# Patient Record
Sex: Female | Born: 1950 | Race: White | Hispanic: No | Smoking: Never smoker
Health system: Southern US, Community
[De-identification: ages and names within clinical notes are randomized; demographics above are authoritative.]

## PROBLEM LIST (undated history)

## (undated) DIAGNOSIS — F419 Anxiety disorder, unspecified: Secondary | ICD-10-CM

## (undated) DIAGNOSIS — F329 Major depressive disorder, single episode, unspecified: Secondary | ICD-10-CM

## (undated) DIAGNOSIS — Z973 Presence of spectacles and contact lenses: Secondary | ICD-10-CM

## (undated) DIAGNOSIS — E785 Hyperlipidemia, unspecified: Secondary | ICD-10-CM

## (undated) DIAGNOSIS — K219 Gastro-esophageal reflux disease without esophagitis: Secondary | ICD-10-CM

## (undated) DIAGNOSIS — F32A Depression, unspecified: Secondary | ICD-10-CM

## (undated) HISTORY — PX: COLONOSCOPY: SHX174

## (undated) HISTORY — PX: BREAST EXCISIONAL BIOPSY: SUR124

## (undated) HISTORY — PX: TONSILLECTOMY: SUR1361

---

## 1993-04-05 HISTORY — PX: ABDOMINAL HYSTERECTOMY: SHX81

## 1993-04-05 HISTORY — PX: APPENDECTOMY: SHX54

## 2004-07-21 ENCOUNTER — Ambulatory Visit: Payer: Self-pay

## 2004-07-27 ENCOUNTER — Ambulatory Visit: Payer: Self-pay

## 2006-11-29 ENCOUNTER — Ambulatory Visit: Payer: Self-pay

## 2007-12-07 ENCOUNTER — Ambulatory Visit: Payer: Self-pay

## 2009-12-31 ENCOUNTER — Ambulatory Visit: Payer: Self-pay

## 2011-03-22 ENCOUNTER — Other Ambulatory Visit: Payer: Self-pay

## 2011-03-22 DIAGNOSIS — Z1231 Encounter for screening mammogram for malignant neoplasm of breast: Secondary | ICD-10-CM

## 2011-04-09 ENCOUNTER — Ambulatory Visit
Admission: RE | Admit: 2011-04-09 | Discharge: 2011-04-09 | Disposition: A | Discharge status: Home / Self Care | Payer: BC Managed Care – PPO | Source: Ambulatory Visit

## 2011-04-09 DIAGNOSIS — Z1231 Encounter for screening mammogram for malignant neoplasm of breast: Secondary | ICD-10-CM

## 2012-04-27 ENCOUNTER — Other Ambulatory Visit: Payer: Self-pay

## 2012-04-27 DIAGNOSIS — Z1231 Encounter for screening mammogram for malignant neoplasm of breast: Secondary | ICD-10-CM

## 2012-05-17 ENCOUNTER — Ambulatory Visit: Payer: BC Managed Care – PPO

## 2012-05-31 ENCOUNTER — Ambulatory Visit
Admission: RE | Admit: 2012-05-31 | Discharge: 2012-05-31 | Disposition: A | Discharge status: Home / Self Care | Payer: BC Managed Care – PPO | Source: Ambulatory Visit

## 2012-05-31 DIAGNOSIS — Z1231 Encounter for screening mammogram for malignant neoplasm of breast: Secondary | ICD-10-CM

## 2013-04-05 HISTORY — PX: EYE SURGERY: SHX253

## 2013-05-02 ENCOUNTER — Other Ambulatory Visit: Payer: Self-pay

## 2013-05-02 DIAGNOSIS — Z1231 Encounter for screening mammogram for malignant neoplasm of breast: Secondary | ICD-10-CM

## 2013-06-06 ENCOUNTER — Ambulatory Visit
Admission: RE | Admit: 2013-06-06 | Discharge: 2013-06-06 | Disposition: A | Discharge status: Home / Self Care | Payer: BC Managed Care – PPO | Source: Ambulatory Visit

## 2013-06-06 DIAGNOSIS — Z1231 Encounter for screening mammogram for malignant neoplasm of breast: Secondary | ICD-10-CM

## 2013-06-07 ENCOUNTER — Other Ambulatory Visit: Payer: Self-pay

## 2013-06-07 DIAGNOSIS — R928 Other abnormal and inconclusive findings on diagnostic imaging of breast: Secondary | ICD-10-CM

## 2013-06-20 ENCOUNTER — Other Ambulatory Visit: Payer: Self-pay

## 2013-06-20 ENCOUNTER — Ambulatory Visit
Admission: RE | Admit: 2013-06-20 | Discharge: 2013-06-20 | Disposition: A | Discharge status: Home / Self Care | Payer: BC Managed Care – PPO | Source: Ambulatory Visit

## 2013-06-20 DIAGNOSIS — R928 Other abnormal and inconclusive findings on diagnostic imaging of breast: Secondary | ICD-10-CM

## 2013-06-20 DIAGNOSIS — N6310 Unspecified lump in the right breast, unspecified quadrant: Secondary | ICD-10-CM

## 2013-07-04 ENCOUNTER — Ambulatory Visit
Admission: RE | Admit: 2013-07-04 | Discharge: 2013-07-04 | Disposition: A | Discharge status: Home / Self Care | Payer: BC Managed Care – PPO | Source: Ambulatory Visit

## 2013-07-04 ENCOUNTER — Other Ambulatory Visit: Payer: Self-pay

## 2013-07-04 DIAGNOSIS — N6310 Unspecified lump in the right breast, unspecified quadrant: Secondary | ICD-10-CM

## 2013-07-08 LAB — CULTURE, ROUTINE-ABSCESS: CULTURE: NO GROWTH

## 2013-11-12 ENCOUNTER — Other Ambulatory Visit: Payer: Self-pay

## 2013-11-12 DIAGNOSIS — N6310 Unspecified lump in the right breast, unspecified quadrant: Secondary | ICD-10-CM

## 2013-11-23 ENCOUNTER — Other Ambulatory Visit: Payer: Self-pay

## 2013-11-23 DIAGNOSIS — N6310 Unspecified lump in the right breast, unspecified quadrant: Secondary | ICD-10-CM

## 2013-11-27 ENCOUNTER — Ambulatory Visit
Admission: RE | Admit: 2013-11-27 | Discharge: 2013-11-27 | Disposition: A | Discharge status: Home / Self Care | Payer: BC Managed Care – PPO | Source: Ambulatory Visit

## 2013-11-27 DIAGNOSIS — N6310 Unspecified lump in the right breast, unspecified quadrant: Secondary | ICD-10-CM

## 2013-11-28 ENCOUNTER — Telehealth (INDEPENDENT_AMBULATORY_CARE_PROVIDER_SITE_OTHER): Payer: Self-pay

## 2013-11-28 NOTE — Telephone Encounter (Signed)
Spoke to [employer] at [employer] . She states she had the scheduled for 12/06/13

## 2013-12-06 ENCOUNTER — Other Ambulatory Visit (INDEPENDENT_AMBULATORY_CARE_PROVIDER_SITE_OTHER): Payer: Self-pay | Admitting: General Surgery

## 2014-01-23 ENCOUNTER — Encounter (HOSPITAL_BASED_OUTPATIENT_CLINIC_OR_DEPARTMENT_OTHER): Payer: Self-pay | Admitting: *Deleted

## 2014-01-23 NOTE — Progress Notes (Signed)
To come in for [facility] labs /ua

## 2014-01-25 ENCOUNTER — Encounter (HOSPITAL_BASED_OUTPATIENT_CLINIC_OR_DEPARTMENT_OTHER)
Admission: RE | Admit: 2014-01-25 | Discharge: 2014-01-25 | Disposition: A | Discharge status: Home / Self Care | Payer: BC Managed Care – PPO | Source: Ambulatory Visit | Attending: General Surgery | Admitting: General Surgery

## 2014-01-25 ENCOUNTER — Encounter (INDEPENDENT_AMBULATORY_CARE_PROVIDER_SITE_OTHER): Payer: BC Managed Care – PPO | Admitting: Ophthalmology

## 2014-01-25 DIAGNOSIS — H4422 Degenerative myopia, left eye: Secondary | ICD-10-CM

## 2014-01-25 DIAGNOSIS — H35341 Macular cyst, hole, or pseudohole, right eye: Secondary | ICD-10-CM

## 2014-01-25 DIAGNOSIS — H35371 Puckering of macula, right eye: Secondary | ICD-10-CM

## 2014-01-25 DIAGNOSIS — Z01812 Encounter for preprocedural laboratory examination: Secondary | ICD-10-CM | POA: Insufficient documentation

## 2014-01-25 DIAGNOSIS — H43313 Vitreous membranes and strands, bilateral: Secondary | ICD-10-CM

## 2014-01-25 LAB — CBC WITH DIFFERENTIAL/PLATELET
BASOS PCT: 0 % (ref 0–1)
Basophils Absolute: 0 10*3/uL (ref 0.0–0.1)
Eosinophils Absolute: 0.1 10*3/uL (ref 0.0–0.7)
Eosinophils Relative: 2 % (ref 0–5)
HCT: 38.9 % (ref 36.0–46.0)
Hemoglobin: 12.6 g/dL (ref 12.0–15.0)
Lymphocytes Relative: 25 % (ref 12–46)
Lymphs Abs: 1.8 10*3/uL (ref 0.7–4.0)
MCH: 28.6 pg (ref 26.0–34.0)
MCHC: 32.4 g/dL (ref 30.0–36.0)
MCV: 88.2 fL (ref 78.0–100.0)
Monocytes Absolute: 0.9 10*3/uL (ref 0.1–1.0)
Monocytes Relative: 12 % (ref 3–12)
NEUTROS PCT: 61 % (ref 43–77)
Neutro Abs: 4.5 10*3/uL (ref 1.7–7.7)
Platelets: 186 10*3/uL (ref 150–400)
RBC: 4.41 MIL/uL (ref 3.87–5.11)
RDW: 13.5 % (ref 11.5–15.5)
WBC: 7.4 10*3/uL (ref 4.0–10.5)

## 2014-01-25 LAB — URINALYSIS, ROUTINE W REFLEX MICROSCOPIC
Bilirubin Urine: NEGATIVE
GLUCOSE, UA: NEGATIVE mg/dL
KETONES UR: NEGATIVE mg/dL
LEUKOCYTES UA: NEGATIVE
Nitrite: NEGATIVE
Protein, ur: NEGATIVE mg/dL
Specific Gravity, Urine: 1.019 (ref 1.005–1.030)
Urobilinogen, UA: 0.2 mg/dL (ref 0.0–1.0)
pH: 5.5 (ref 5.0–8.0)

## 2014-01-25 LAB — COMPREHENSIVE METABOLIC PANEL
ALK PHOS: 97 U/L (ref 39–117)
ALT: 12 U/L (ref 0–35)
AST: 21 U/L (ref 0–37)
Albumin: 3.6 g/dL (ref 3.5–5.2)
Anion gap: 13 (ref 5–15)
BILIRUBIN TOTAL: 1 mg/dL (ref 0.3–1.2)
BUN: 21 mg/dL (ref 6–23)
CO2: 26 mEq/L (ref 19–32)
Calcium: 8.9 mg/dL (ref 8.4–10.5)
Chloride: 100 mEq/L (ref 96–112)
Creatinine, Ser: 0.9 mg/dL (ref 0.50–1.10)
GFR calc Af Amer: 78 mL/min — ABNORMAL LOW (ref >90)
GFR calc non Af Amer: 67 mL/min — ABNORMAL LOW (ref >90)
Glucose, Bld: 99 mg/dL (ref 70–99)
Potassium: 4.1 mEq/L (ref 3.7–5.3)
Sodium: 139 mEq/L (ref 137–147)
Total Protein: 7 g/dL (ref 6.0–8.3)

## 2014-01-25 LAB — URINE MICROSCOPIC-ADD ON
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
## 2014-01-29 ENCOUNTER — Ambulatory Visit (HOSPITAL_BASED_OUTPATIENT_CLINIC_OR_DEPARTMENT_OTHER): Payer: BC Managed Care – PPO | Admitting: Anesthesiology

## 2014-01-29 ENCOUNTER — Encounter (HOSPITAL_BASED_OUTPATIENT_CLINIC_OR_DEPARTMENT_OTHER)
Admission: RE | Disposition: A | Discharge status: Home / Self Care | Payer: Self-pay | Source: Ambulatory Visit | Attending: General Surgery

## 2014-01-29 ENCOUNTER — Ambulatory Visit (HOSPITAL_BASED_OUTPATIENT_CLINIC_OR_DEPARTMENT_OTHER)
Admission: RE | Admit: 2014-01-29 | Discharge: 2014-01-29 | Disposition: A | Discharge status: Home / Self Care | Payer: BC Managed Care – PPO | Source: Ambulatory Visit | Attending: General Surgery | Admitting: General Surgery

## 2014-01-29 ENCOUNTER — Encounter (HOSPITAL_BASED_OUTPATIENT_CLINIC_OR_DEPARTMENT_OTHER): Payer: BC Managed Care – PPO | Admitting: Anesthesiology

## 2014-01-29 ENCOUNTER — Encounter (HOSPITAL_BASED_OUTPATIENT_CLINIC_OR_DEPARTMENT_OTHER): Payer: Self-pay | Admitting: *Deleted

## 2014-01-29 DIAGNOSIS — Z803 Family history of malignant neoplasm of breast: Secondary | ICD-10-CM | POA: Insufficient documentation

## 2014-01-29 DIAGNOSIS — N61 Inflammatory disorders of breast: Secondary | ICD-10-CM | POA: Insufficient documentation

## 2014-01-29 DIAGNOSIS — K219 Gastro-esophageal reflux disease without esophagitis: Secondary | ICD-10-CM | POA: Insufficient documentation

## 2014-01-29 DIAGNOSIS — N63 Unspecified lump in breast: Secondary | ICD-10-CM | POA: Diagnosis present

## 2014-01-29 DIAGNOSIS — N6310 Unspecified lump in the right breast, unspecified quadrant: Secondary | ICD-10-CM

## 2014-01-29 DIAGNOSIS — E780 Pure hypercholesterolemia: Secondary | ICD-10-CM | POA: Insufficient documentation

## 2014-01-29 HISTORY — DX: Hyperlipidemia, unspecified: E78.5

## 2014-01-29 HISTORY — DX: Gastro-esophageal reflux disease without esophagitis: K21.9

## 2014-01-29 HISTORY — PX: MASS EXCISION: SHX2000

## 2014-01-29 HISTORY — DX: Presence of spectacles and contact lenses: Z97.3

## 2014-01-29 HISTORY — DX: Depression, unspecified: F32.A

## 2014-01-29 HISTORY — DX: Anxiety disorder, unspecified: F41.9

## 2014-01-29 HISTORY — DX: Major depressive disorder, single episode, unspecified: F32.9

## 2014-01-29 LAB — POCT HEMOGLOBIN-HEMACUE: Hemoglobin: 12.8 g/dL (ref 12.0–15.0)

## 2014-01-29 SURGERY — EXCISION MASS
Anesthesia: General | Site: Breast | Laterality: Right

## 2014-01-29 MED ORDER — DEXAMETHASONE SODIUM PHOSPHATE 4 MG/ML IJ SOLN
INTRAMUSCULAR | Status: DC | PRN
  Administered 2014-01-29: 10 mg via INTRAVENOUS

## 2014-01-29 MED ORDER — OXYCODONE HCL 5 MG PO TABS: 5.0000 mg | ORAL_TABLET | Freq: Once | ORAL | Status: DC | PRN

## 2014-01-29 MED ORDER — ALBUTEROL SULFATE (2.5 MG/3ML) 0.083% IN NEBU
INHALATION_SOLUTION | RESPIRATORY_TRACT | Status: AC
Start: 2014-01-29 — End: 2014-01-29
  Filled 2014-01-29: qty 3

## 2014-01-29 MED ORDER — EPHEDRINE SULFATE 50 MG/ML IJ SOLN
INTRAMUSCULAR | Status: DC | PRN
  Administered 2014-01-29: 15 mg via INTRAVENOUS

## 2014-01-29 MED ORDER — MIDAZOLAM HCL 5 MG/5ML IJ SOLN
INTRAMUSCULAR | Status: DC | PRN
  Administered 2014-01-29: 2 mg via INTRAVENOUS

## 2014-01-29 MED ORDER — CEFAZOLIN SODIUM-DEXTROSE 2-3 GM-% IV SOLR
2.0000 g | INTRAVENOUS | Status: AC
Start: 2014-01-29 — End: 2014-01-29
  Administered 2014-01-29: 2 g via INTRAVENOUS

## 2014-01-29 MED ORDER — MIDAZOLAM HCL 2 MG/2ML IJ SOLN: 1.0000 mg | INTRAMUSCULAR | Status: DC | PRN

## 2014-01-29 MED ORDER — PHENYLEPHRINE HCL 10 MG/ML IJ SOLN
INTRAMUSCULAR | Status: DC | PRN
  Administered 2014-01-29: 40 ug via INTRAVENOUS

## 2014-01-29 MED ORDER — LIDOCAINE HCL (CARDIAC) 20 MG/ML IV SOLN
INTRAVENOUS | Status: DC | PRN
  Administered 2014-01-29: 75 mg via INTRAVENOUS

## 2014-01-29 MED ORDER — ACETAMINOPHEN 325 MG PO TABS: 650.0000 mg | ORAL_TABLET | ORAL | Status: DC | PRN

## 2014-01-29 MED ORDER — MIDAZOLAM HCL 2 MG/2ML IJ SOLN
INTRAMUSCULAR | Status: AC
  Filled 2014-01-29: qty 2

## 2014-01-29 MED ORDER — FENTANYL CITRATE 0.05 MG/ML IJ SOLN
INTRAMUSCULAR | Status: DC | PRN
  Administered 2014-01-29 (×2): 50 ug via INTRAVENOUS

## 2014-01-29 MED ORDER — HYDROMORPHONE HCL 1 MG/ML IJ SOLN: 0.2500 mg | INTRAMUSCULAR | Status: DC | PRN

## 2014-01-29 MED ORDER — CHLORHEXIDINE GLUCONATE 4 % EX LIQD: 1.0000 "application " | Freq: Once | CUTANEOUS | Status: DC

## 2014-01-29 MED ORDER — LACTATED RINGERS IV SOLN
INTRAVENOUS | Status: DC
Start: 2014-01-29 — End: 2014-01-29
  Administered 2014-01-29 (×3): via INTRAVENOUS

## 2014-01-29 MED ORDER — ACETAMINOPHEN 650 MG RE SUPP: 650.0000 mg | RECTAL | Status: DC | PRN

## 2014-01-29 MED ORDER — FENTANYL CITRATE 0.05 MG/ML IJ SOLN
INTRAMUSCULAR | Status: AC
  Filled 2014-01-29: qty 2

## 2014-01-29 MED ORDER — SODIUM CHLORIDE 0.9 % IJ SOLN: 3.0000 mL | INTRAMUSCULAR | Status: DC | PRN

## 2014-01-29 MED ORDER — FENTANYL CITRATE 0.05 MG/ML IJ SOLN: 50.0000 ug | INTRAMUSCULAR | Status: DC | PRN

## 2014-01-29 MED ORDER — FENTANYL CITRATE 0.05 MG/ML IJ SOLN
25.0000 ug | INTRAMUSCULAR | Status: DC | PRN
  Administered 2014-01-29 (×3): 25 ug via INTRAVENOUS

## 2014-01-29 MED ORDER — AMOXICILLIN-POT CLAVULANATE 875-125 MG PO TABS: 1.0000 | ORAL_TABLET | Freq: Two times a day (BID) | ORAL | Status: AC

## 2014-01-29 MED ORDER — HYDROCODONE-ACETAMINOPHEN 5-325 MG PO TABS: 1.0000 | ORAL_TABLET | Freq: Four times a day (QID) | ORAL | Status: AC | PRN

## 2014-01-29 MED ORDER — ONDANSETRON HCL 4 MG/2ML IJ SOLN: 4.0000 mg | Freq: Once | INTRAMUSCULAR | Status: DC | PRN

## 2014-01-29 MED ORDER — PROPOFOL 10 MG/ML IV BOLUS
INTRAVENOUS | Status: DC | PRN
  Administered 2014-01-29: 200 mg via INTRAVENOUS

## 2014-01-29 MED ORDER — FENTANYL CITRATE 0.05 MG/ML IJ SOLN
INTRAMUSCULAR | Status: AC
  Filled 2014-01-29: qty 6

## 2014-01-29 MED ORDER — ALBUTEROL SULFATE (2.5 MG/3ML) 0.083% IN NEBU
2.5000 mg | INHALATION_SOLUTION | Freq: Four times a day (QID) | RESPIRATORY_TRACT | Status: DC | PRN
  Administered 2014-01-29: 2.5 mg via RESPIRATORY_TRACT

## 2014-01-29 MED ORDER — CEFAZOLIN SODIUM-DEXTROSE 2-3 GM-% IV SOLR
INTRAVENOUS | Status: AC
  Filled 2014-01-29: qty 50

## 2014-01-29 MED ORDER — FENTANYL CITRATE 0.05 MG/ML IJ SOLN: 25.0000 ug | INTRAMUSCULAR | Status: DC | PRN

## 2014-01-29 MED ORDER — ONDANSETRON HCL 4 MG/2ML IJ SOLN
INTRAMUSCULAR | Status: DC | PRN
  Administered 2014-01-29: 4 mg via INTRAVENOUS

## 2014-01-29 MED ORDER — SODIUM CHLORIDE 0.9 % IV SOLN: INTRAVENOUS | Status: DC

## 2014-01-29 MED ORDER — SODIUM CHLORIDE 0.9 % IJ SOLN: 3.0000 mL | Freq: Two times a day (BID) | INTRAMUSCULAR | Status: DC

## 2014-01-29 MED ORDER — BUPIVACAINE-EPINEPHRINE 0.5% -1:200000 IJ SOLN
INTRAMUSCULAR | Status: DC | PRN
  Administered 2014-01-29: 10 mL

## 2014-01-29 MED ORDER — SODIUM CHLORIDE 0.9 % IV SOLN: 250.0000 mL | INTRAVENOUS | Status: DC | PRN

## 2014-01-29 MED ORDER — OXYCODONE HCL 5 MG/5ML PO SOLN: 5.0000 mg | Freq: Once | ORAL | Status: DC | PRN

## 2014-01-29 MED ORDER — OXYCODONE HCL 5 MG PO TABS: 5.0000 mg | ORAL_TABLET | ORAL | Status: DC | PRN

## 2014-01-29 SURGICAL SUPPLY — 67 items
APPLIER CLIP 9.375 MED OPEN (MISCELLANEOUS)
BENZOIN TINCTURE PRP APPL 2/3 (GAUZE/BANDAGES/DRESSINGS) IMPLANT
BINDER BREAST LRG (GAUZE/BANDAGES/DRESSINGS) ×3 IMPLANT
BINDER BREAST MEDIUM (GAUZE/BANDAGES/DRESSINGS) IMPLANT
BINDER BREAST XLRG (GAUZE/BANDAGES/DRESSINGS) IMPLANT
BINDER BREAST XXLRG (GAUZE/BANDAGES/DRESSINGS) IMPLANT
BIOPATCH RED 1 DISK 7.0 (GAUZE/BANDAGES/DRESSINGS) ×2 IMPLANT
BIOPATCH RED 1IN DISK 7.0MM (GAUZE/BANDAGES/DRESSINGS) ×1
BLADE HEX COATED 2.75 (ELECTRODE) ×3 IMPLANT
BLADE SURG 15 STRL LF DISP TIS (BLADE) ×1 IMPLANT
BLADE SURG 15 STRL SS (BLADE) ×2
CANISTER SUCT 1200ML W/VALVE (MISCELLANEOUS) ×3 IMPLANT
CHLORAPREP W/TINT 26ML (MISCELLANEOUS) ×3 IMPLANT
CLIP APPLIE 9.375 MED OPEN (MISCELLANEOUS) IMPLANT
CLOSURE WOUND 1/2 X4 (GAUZE/BANDAGES/DRESSINGS)
COVER BACK TABLE 60X90IN (DRAPES) ×3 IMPLANT
COVER MAYO STAND STRL (DRAPES) ×3 IMPLANT
DECANTER SPIKE VIAL GLASS SM (MISCELLANEOUS) IMPLANT
DEVICE DUBIN W/COMP PLATE 8390 (MISCELLANEOUS) IMPLANT
DRAPE LAPAROSCOPIC ABDOMINAL (DRAPES) IMPLANT
DRAPE LAPAROTOMY TRNSV 102X78 (DRAPE) IMPLANT
DRAPE PED LAPAROTOMY (DRAPES) ×3 IMPLANT
DRAPE UTILITY XL STRL (DRAPES) ×3 IMPLANT
DRSG PAD ABDOMINAL 8X10 ST (GAUZE/BANDAGES/DRESSINGS) ×3 IMPLANT
ELECT REM PT RETURN 9FT ADLT (ELECTROSURGICAL) ×3
ELECTRODE REM PT RTRN 9FT ADLT (ELECTROSURGICAL) ×1 IMPLANT
GAUZE SPONGE 4X4 12PLY STRL (GAUZE/BANDAGES/DRESSINGS) ×3 IMPLANT
GAUZE SPONGE 4X4 16PLY XRAY LF (GAUZE/BANDAGES/DRESSINGS) ×6 IMPLANT
GLOVE BIO SURGEON STRL SZ 6.5 (GLOVE) ×2 IMPLANT
GLOVE BIO SURGEONS STRL SZ 6.5 (GLOVE) ×1
GLOVE BIOGEL PI IND STRL 7.0 (GLOVE) ×1 IMPLANT
GLOVE BIOGEL PI INDICATOR 7.0 (GLOVE) ×2
GLOVE EUDERMIC 7 POWDERFREE (GLOVE) ×3 IMPLANT
GOWN STRL REUS W/ TWL LRG LVL3 (GOWN DISPOSABLE) ×1 IMPLANT
GOWN STRL REUS W/ TWL XL LVL3 (GOWN DISPOSABLE) ×1 IMPLANT
GOWN STRL REUS W/TWL LRG LVL3 (GOWN DISPOSABLE) ×2
GOWN STRL REUS W/TWL XL LVL3 (GOWN DISPOSABLE) ×2
KIT MARKER MARGIN INK (KITS) IMPLANT
LIQUID BAND (GAUZE/BANDAGES/DRESSINGS) IMPLANT
NEEDLE HYPO 22GX1.5 SAFETY (NEEDLE) IMPLANT
NEEDLE HYPO 25X1 1.5 SAFETY (NEEDLE) ×3 IMPLANT
NS IRRIG 1000ML POUR BTL (IV SOLUTION) ×3 IMPLANT
PACK BASIN DAY SURGERY FS (CUSTOM PROCEDURE TRAY) ×3 IMPLANT
PENCIL BUTTON HOLSTER BLD 10FT (ELECTRODE) ×3 IMPLANT
SLEEVE SCD COMPRESS KNEE MED (MISCELLANEOUS) ×3 IMPLANT
SPONGE GAUZE 4X4 12PLY STER LF (GAUZE/BANDAGES/DRESSINGS) IMPLANT
SPONGE LAP 18X18 X RAY DECT (DISPOSABLE) ×3 IMPLANT
SPONGE LAP 4X18 X RAY DECT (DISPOSABLE) ×3 IMPLANT
STAPLER VISISTAT 35W (STAPLE) IMPLANT
STRIP CLOSURE SKIN 1/2X4 (GAUZE/BANDAGES/DRESSINGS) IMPLANT
SUT ETHILON 4 0 PS 2 18 (SUTURE) IMPLANT
SUT MNCRL AB 4-0 PS2 18 (SUTURE) ×3 IMPLANT
SUT SILK 2 0 SH (SUTURE) ×3 IMPLANT
SUT VIC AB 2-0 SH 27 (SUTURE)
SUT VIC AB 2-0 SH 27XBRD (SUTURE) IMPLANT
SUT VIC AB 3-0 FS2 27 (SUTURE) IMPLANT
SUT VIC AB 4-0 P-3 18XBRD (SUTURE) IMPLANT
SUT VIC AB 4-0 P3 18 (SUTURE)
SUT VICRYL 3-0 CR8 SH (SUTURE) ×3 IMPLANT
SUT VICRYL 4-0 PS2 18IN ABS (SUTURE) IMPLANT
SYR BULB 3OZ (MISCELLANEOUS) IMPLANT
SYRINGE 10CC LL (SYRINGE) ×3 IMPLANT
TAPE HYPAFIX 4 X10 (GAUZE/BANDAGES/DRESSINGS) IMPLANT
TOWEL OR NON WOVEN STRL DISP B (DISPOSABLE) ×3 IMPLANT
TUBE CONNECTING 20'X1/4 (TUBING) ×1
TUBE CONNECTING 20X1/4 (TUBING) ×2 IMPLANT
YANKAUER SUCT BULB TIP NO VENT (SUCTIONS) ×3 IMPLANT

## 2014-01-29 NOTE — Anesthesia Preprocedure Evaluation (Signed)
Anesthesia Evaluation  Patient identified by MRN, date of birth, ID band Patient awake    Reviewed: Allergy & Precautions, H&P , NPO status , Patient's Chart, lab work & pertinent test results  Airway Mallampati: I  TM Distance: >3 FB Neck ROM: Full    Dental  (+) Teeth Intact, Dental Advisory Given   Pulmonary  breath sounds clear to auscultation        Cardiovascular Rhythm:Regular Rate:Normal     Neuro/Psych    GI/Hepatic GERD-  Medicated and Controlled,  Endo/Other    Renal/GU      Musculoskeletal   Abdominal   Peds  Hematology   Anesthesia Other Findings   Reproductive/Obstetrics                             Anesthesia Physical Anesthesia Plan  ASA: II  Anesthesia Plan: General   Post-op Pain Management:    Induction: Intravenous  Airway Management Planned: LMA  Additional Equipment:   Intra-op Plan:   Post-operative Plan: Extubation in OR  Informed Consent: I have reviewed the patients History and Physical, chart, labs and discussed the procedure including the risks, benefits and alternatives for the proposed anesthesia with the patient or authorized representative who has indicated his/her understanding and acceptance.   Dental advisory given  Plan Discussed with: CRNA, Anesthesiologist and Surgeon  Anesthesia Plan Comments:         Anesthesia Quick Evaluation

## 2014-01-29 NOTE — Anesthesia Postprocedure Evaluation (Signed)
  Anesthesia Post-op Note  Patient: Melinda Sullivan  Procedure(s) Performed: Procedure(s): EXCISE RIGHT BREAST MASS (Right)  Patient Location: PACU  Anesthesia Type: General   Level of Consciousness: awake, alert  and oriented  Airway and Oxygen Therapy: Patient Spontanous Breathing  Post-op Pain: mild  Post-op Assessment: Post-op Vital signs reviewed  Post-op Vital Signs: Reviewed  Last Vitals:  Filed Vitals:   01/29/14 1200  BP: 181/77  Pulse: 76  Temp:   Resp: 9    Complications: No apparent anesthesia complications

## 2014-01-29 NOTE — Transfer of Care (Signed)
Immediate Anesthesia Transfer of Care Note  Patient: Melinda Sullivan  Procedure(s) Performed: Procedure(s): EXCISE RIGHT BREAST MASS (Right)  Patient Location: PACU  Anesthesia Type:General  Level of Consciousness: awake and patient cooperative  Airway & Oxygen Therapy: Patient Spontanous Breathing and Patient connected to face mask oxygen  Post-op Assessment: Report given to PACU RN and Post -op Vital signs reviewed and stable  Post vital signs: Reviewed and stable  Complications: No apparent anesthesia complications

## 2014-01-29 NOTE — Discharge Instructions (Signed)
Keep a written record of the drainage and bring that to the office with you next week.  You will need to sponge bathe. No showers or tub baths until we remove the drain.  You may drive a car when comfortable, but not until tomorrow.       [place] Office Phone Number [phone]  BREAST BIOPSY/ PARTIAL MASTECTOMY: POST OP INSTRUCTIONS  Always review your discharge instruction sheet given to you by the facility where your surgery was performed.  IF YOU HAVE DISABILITY OR FAMILY LEAVE FORMS, YOU MUST BRING THEM TO THE OFFICE FOR PROCESSING.  DO NOT GIVE THEM TO YOUR DOCTOR.  1. A prescription for pain medication may be given to you upon discharge.  Take your pain medication as prescribed, if needed.  If narcotic pain medicine is not needed, then you may take acetaminophen (Tylenol) or ibuprofen (Advil) as needed. 2. Take your usually prescribed medications unless otherwise directed 3. If you need a refill on your pain medication, please contact your pharmacy.  They will contact our office to request authorization.  Prescriptions will not be filled after 5pm or on week-ends. 4. You should eat very light the first 24 hours after surgery, such as soup, crackers, pudding, etc.  Resume your normal diet the day after surgery. 5. Most patients will experience some swelling and bruising in the breast.  Ice packs and a good support bra will help.  Swelling and bruising can take several days to resolve.  6. It is common to experience some constipation if taking pain medication after surgery.  Increasing fluid intake and taking a stool softener will usually help or prevent this problem from occurring.  A mild laxative (Milk of Magnesia or Miralax) should be taken according to package directions if there are no bowel movements after 48 hours. 7. Unless discharge instructions indicate otherwise, you may remove your bandages 24-48 hours after surgery, and you may shower at that time.   You may have steri-strips (small skin tapes) in place directly over the incision.  These strips should be left on the skin for 7-10 days.  If your surgeon used skin glue on the incision, you may shower in 24 hours.  The glue will flake off over the next 2-3 weeks.  Any sutures or staples will be removed at the office during your follow-up visit. 8. ACTIVITIES:  You may resume regular daily activities (gradually increasing) beginning the next day.  Wearing a good support bra or sports bra minimizes pain and swelling.  You may have sexual intercourse when it is comfortable. a. You may drive when you no longer are taking prescription pain medication, you can comfortably wear a seatbelt, and you can safely maneuver your car and apply brakes. b. RETURN TO WORK:  ______________________________________________________________________________________ 9. You should see your doctor in the office for a follow-up appointment approximately two weeks after your surgery.  Your doctors nurse will typically make your follow-up appointment when she calls you with your pathology report.  Expect your pathology report 2-3 business days after your surgery.  You may call to check if you do not hear from [place] after three days. 10. OTHER INSTRUCTIONS: _______________________________________________________________________________________________ _____________________________________________________________________________________________________________________________________ _____________________________________________________________________________________________________________________________________ _____________________________________________________________________________________________________________________________________  WHEN TO CALL YOUR DOCTOR: 1. Fever over 101.0 2. Nausea and/or vomiting. 3. Extreme swelling or bruising. 4. Continued bleeding from incision. 5. Increased pain, redness, or drainage from  the incision.  The clinic staff is available to answer your questions during regular business hours.  Please dont hesitate  to call and ask to speak to one of the nurses for clinical concerns.  If you have a medical emergency, go to the nearest emergency room or call 911.  A surgeon from [facility] is always on call at the hospital.  For further questions, please visit [email or web address]   Post Anesthesia Home Care Instructions  Activity: Get plenty of rest for the remainder of the day. A responsible adult should stay with you for 24 hours following the procedure.  For the next 24 hours, DO NOT: -Drive a car -Paediatric nurse -Drink alcoholic beverages -Take any medication unless instructed by your physician -Make any legal decisions or sign important papers.  Meals: Start with liquid foods such as gelatin or soup. Progress to regular foods as tolerated. Avoid greasy, spicy, heavy foods. If nausea and/or vomiting occur, drink only clear liquids until the nausea and/or vomiting subsides. Call your physician if vomiting continues.  Special Instructions/Symptoms: Your throat may feel dry or sore from the anesthesia or the breathing tube placed in your throat during surgery. If this causes discomfort, gargle with warm salt water. The discomfort should disappear within 24 hours.  About my Jackson-Pratt Bulb Drain  What is a Jackson-Pratt bulb? A Jackson-Pratt is a soft, round device used to collect drainage. It is connected to a long, thin drainage catheter, which is held in place by one or two small stiches near your surgical incision site. When the bulb is squeezed, it forms a vacuum, forcing the drainage to empty into the bulb.   Squeezing the Jackson-Pratt Bulb- To squeeze the bulb: 1. Make sure the plug at the top of the bulb is open. 2. Squeeze the bulb tightly in your fist. You will hear air squeezing from the bulb. 3. Replace the plug while the bulb is  squeezed. 4. Use a safety pin to attach the bulb to your clothing. This will keep the catheter from     pulling at the bulb insertion site.  When to call your doctor- Call your doctor if:  Drain site becomes red, swollen or hot.  You have a fever greater than 101 degrees F.  There is oozing at the drain site.  Drain falls out (apply a guaze bandage over the drain hole and secure it with tape).  Drainage increases daily not related to activity patterns. (You will usually have more drainage when you are active than when you are resting.)  Drainage has a bad odor.    JP Drain [employer] this sheet to all of your post-operative appointments while you have your drains.  Please measure your drains by CC's or ML's.  Make sure you drain and measure your JP Drains 3 times per day.  At the end of each day, add up totals for the left side and add up totals for the right side.    ( 9 am )     ( 3 pm )        ( 9 pm )                Date L  R  L  R  L  R  Total L/R

## 2014-01-29 NOTE — Anesthesia Procedure Notes (Signed)
Procedure Name: LMA Insertion Date/Time: 01/29/2014 10:20 AM Performed by: Melynda Ripple D Pre-anesthesia Checklist: Patient identified, Emergency Drugs available, Suction available and Patient being monitored Patient Re-evaluated:Patient Re-evaluated prior to inductionOxygen Delivery Method: Circle System Utilized Preoxygenation: Pre-oxygenation with 100% oxygen Intubation Type: IV induction Ventilation: Mask ventilation without difficulty LMA: LMA inserted LMA Size: 4.0 Number of attempts: 1 Airway Equipment and Method: bite block Placement Confirmation: positive ETCO2 Tube secured with: Tape Dental Injury: Teeth and Oropharynx as per pre-operative assessment

## 2014-01-29 NOTE — Interval H&P Note (Signed)
History and Physical Interval Note:  01/29/2014 9:15 AM  Melinda Sullivan  has presented today for surgery, with the diagnosis of breast mass  The various methods of treatment have been discussed with the patient and family. After consideration of risks, benefits and other options for treatment, the patient has consented to  Procedure(s): EXCISE RIGHT BREAST MASS (Right) as a surgical intervention .  The patient's history has been reviewed, patient examined today, no change in status, stable for surgery.  I have reviewed the patient's chart and labs.  Questions were answered to the patient's satisfaction.     [name]
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
## 2014-01-30 NOTE — Progress Notes (Signed)
Quick Note:  Inform patient of Pathology report,. Benign. Good news. Will discuss in detail at next [place] ______

## 2014-01-31 ENCOUNTER — Encounter (HOSPITAL_BASED_OUTPATIENT_CLINIC_OR_DEPARTMENT_OTHER): Payer: Self-pay | Admitting: General Surgery

## 2014-01-31 ENCOUNTER — Telehealth (INDEPENDENT_AMBULATORY_CARE_PROVIDER_SITE_OTHER): Payer: Self-pay

## 2014-01-31 NOTE — Telephone Encounter (Signed)
Message copied by Dois Davenport on Thu Jan 31, 2014  9:30 AM ------      Message from: [name]      Created: Wed Jan 30, 2014 12:50 PM       Inform patient of Pathology report,. Benign.  Good news. Will discuss in detail at next OV.            hmi ------

## 2014-01-31 NOTE — Telephone Encounter (Signed)
Per Dr Darrel Hoover request pt notified of path result.

## 2014-02-01 LAB — CULTURE, ROUTINE-ABSCESS: Culture: NO GROWTH

## 2014-02-03 LAB — ANAEROBIC CULTURE

## 2014-07-31 ENCOUNTER — Ambulatory Visit (INDEPENDENT_AMBULATORY_CARE_PROVIDER_SITE_OTHER): Payer: BLUE CROSS/BLUE SHIELD | Admitting: Ophthalmology

## 2014-07-31 DIAGNOSIS — H35341 Macular cyst, hole, or pseudohole, right eye: Secondary | ICD-10-CM

## 2014-07-31 DIAGNOSIS — H43813 Vitreous degeneration, bilateral: Secondary | ICD-10-CM

## 2014-12-19 ENCOUNTER — Other Ambulatory Visit: Payer: Self-pay

## 2014-12-19 DIAGNOSIS — Z1231 Encounter for screening mammogram for malignant neoplasm of breast: Secondary | ICD-10-CM

## 2015-02-18 ENCOUNTER — Ambulatory Visit
Admission: RE | Admit: 2015-02-18 | Discharge: 2015-02-18 | Disposition: A | Discharge status: Home / Self Care | Payer: BLUE CROSS/BLUE SHIELD | Source: Ambulatory Visit

## 2015-02-18 DIAGNOSIS — Z1231 Encounter for screening mammogram for malignant neoplasm of breast: Secondary | ICD-10-CM

## 2015-08-06 ENCOUNTER — Ambulatory Visit (INDEPENDENT_AMBULATORY_CARE_PROVIDER_SITE_OTHER): Payer: BLUE CROSS/BLUE SHIELD | Admitting: Ophthalmology

## 2016-01-20 ENCOUNTER — Other Ambulatory Visit: Payer: Self-pay | Admitting: Family Medicine

## 2016-01-20 DIAGNOSIS — Z1231 Encounter for screening mammogram for malignant neoplasm of breast: Secondary | ICD-10-CM

## 2016-02-19 ENCOUNTER — Ambulatory Visit
Admission: RE | Admit: 2016-02-19 | Discharge: 2016-02-19 | Disposition: A | Discharge status: Home / Self Care | Payer: BLUE CROSS/BLUE SHIELD | Source: Ambulatory Visit | Attending: Family Medicine | Admitting: Family Medicine

## 2016-02-19 DIAGNOSIS — Z1231 Encounter for screening mammogram for malignant neoplasm of breast: Secondary | ICD-10-CM

## 2016-06-23 ENCOUNTER — Encounter (INDEPENDENT_AMBULATORY_CARE_PROVIDER_SITE_OTHER): Payer: Medicare Other | Admitting: Ophthalmology

## 2016-06-23 DIAGNOSIS — I10 Essential (primary) hypertension: Secondary | ICD-10-CM

## 2016-06-23 DIAGNOSIS — H43813 Vitreous degeneration, bilateral: Secondary | ICD-10-CM | POA: Diagnosis not present

## 2016-06-23 DIAGNOSIS — H35033 Hypertensive retinopathy, bilateral: Secondary | ICD-10-CM

## 2016-06-23 DIAGNOSIS — H35341 Macular cyst, hole, or pseudohole, right eye: Secondary | ICD-10-CM | POA: Diagnosis not present

## 2016-12-28 ENCOUNTER — Other Ambulatory Visit: Payer: Self-pay | Admitting: Family Medicine

## 2016-12-28 DIAGNOSIS — Z1231 Encounter for screening mammogram for malignant neoplasm of breast: Secondary | ICD-10-CM

## 2017-02-23 ENCOUNTER — Ambulatory Visit
Admission: RE | Admit: 2017-02-23 | Discharge: 2017-02-23 | Disposition: A | Discharge status: Home / Self Care | Payer: Medicare Other | Source: Ambulatory Visit | Attending: Family Medicine | Admitting: Family Medicine

## 2017-02-23 DIAGNOSIS — Z1231 Encounter for screening mammogram for malignant neoplasm of breast: Secondary | ICD-10-CM

## 2017-06-29 ENCOUNTER — Ambulatory Visit (INDEPENDENT_AMBULATORY_CARE_PROVIDER_SITE_OTHER): Payer: Medicare Other | Admitting: Ophthalmology

## 2017-08-12 ENCOUNTER — Encounter (INDEPENDENT_AMBULATORY_CARE_PROVIDER_SITE_OTHER): Payer: Medicare Other | Admitting: Ophthalmology

## 2017-08-12 DIAGNOSIS — H43813 Vitreous degeneration, bilateral: Secondary | ICD-10-CM

## 2017-08-12 DIAGNOSIS — H35341 Macular cyst, hole, or pseudohole, right eye: Secondary | ICD-10-CM | POA: Diagnosis not present

## 2017-08-12 DIAGNOSIS — H35033 Hypertensive retinopathy, bilateral: Secondary | ICD-10-CM

## 2017-08-12 DIAGNOSIS — I10 Essential (primary) hypertension: Secondary | ICD-10-CM

## 2018-01-13 ENCOUNTER — Other Ambulatory Visit: Payer: Self-pay | Admitting: Family Medicine

## 2018-01-13 DIAGNOSIS — Z1231 Encounter for screening mammogram for malignant neoplasm of breast: Secondary | ICD-10-CM

## 2018-02-24 ENCOUNTER — Ambulatory Visit
Admission: RE | Admit: 2018-02-24 | Discharge: 2018-02-24 | Disposition: A | Discharge status: Home / Self Care | Payer: Medicare Other | Source: Ambulatory Visit | Attending: Family Medicine | Admitting: Family Medicine

## 2018-02-24 DIAGNOSIS — Z1231 Encounter for screening mammogram for malignant neoplasm of breast: Secondary | ICD-10-CM

## 2018-08-15 ENCOUNTER — Encounter (INDEPENDENT_AMBULATORY_CARE_PROVIDER_SITE_OTHER): Payer: Medicare Other | Admitting: Ophthalmology

## 2018-08-15 ENCOUNTER — Other Ambulatory Visit: Payer: Self-pay

## 2018-08-15 DIAGNOSIS — H35341 Macular cyst, hole, or pseudohole, right eye: Secondary | ICD-10-CM | POA: Diagnosis not present

## 2018-08-15 DIAGNOSIS — H43813 Vitreous degeneration, bilateral: Secondary | ICD-10-CM

## 2018-08-15 DIAGNOSIS — H35033 Hypertensive retinopathy, bilateral: Secondary | ICD-10-CM | POA: Diagnosis not present

## 2018-08-15 DIAGNOSIS — I10 Essential (primary) hypertension: Secondary | ICD-10-CM

## 2019-01-09 ENCOUNTER — Other Ambulatory Visit: Payer: Self-pay | Admitting: Family Medicine

## 2019-01-09 DIAGNOSIS — Z1231 Encounter for screening mammogram for malignant neoplasm of breast: Secondary | ICD-10-CM

## 2019-02-27 ENCOUNTER — Ambulatory Visit
Admission: RE | Admit: 2019-02-27 | Discharge: 2019-02-27 | Disposition: A | Discharge status: Home / Self Care | Payer: Medicare Other | Source: Ambulatory Visit | Attending: Family Medicine | Admitting: Family Medicine

## 2019-02-27 ENCOUNTER — Other Ambulatory Visit: Payer: Self-pay

## 2019-02-27 DIAGNOSIS — Z1231 Encounter for screening mammogram for malignant neoplasm of breast: Secondary | ICD-10-CM

## 2019-08-16 ENCOUNTER — Other Ambulatory Visit: Payer: Self-pay

## 2019-08-16 ENCOUNTER — Encounter (INDEPENDENT_AMBULATORY_CARE_PROVIDER_SITE_OTHER): Payer: Medicare Other | Admitting: Ophthalmology

## 2019-08-16 DIAGNOSIS — H35371 Puckering of macula, right eye: Secondary | ICD-10-CM

## 2019-08-16 DIAGNOSIS — H43813 Vitreous degeneration, bilateral: Secondary | ICD-10-CM

## 2019-08-16 DIAGNOSIS — H35033 Hypertensive retinopathy, bilateral: Secondary | ICD-10-CM | POA: Diagnosis not present

## 2019-08-16 DIAGNOSIS — I10 Essential (primary) hypertension: Secondary | ICD-10-CM

## 2019-08-16 DIAGNOSIS — H35341 Macular cyst, hole, or pseudohole, right eye: Secondary | ICD-10-CM

## 2019-10-31 ENCOUNTER — Ambulatory Visit: Payer: Medicare Other | Admitting: Dermatology

## 2019-10-31 ENCOUNTER — Other Ambulatory Visit: Payer: Self-pay

## 2019-10-31 DIAGNOSIS — L719 Rosacea, unspecified: Secondary | ICD-10-CM | POA: Diagnosis not present

## 2019-10-31 DIAGNOSIS — D229 Melanocytic nevi, unspecified: Secondary | ICD-10-CM

## 2019-10-31 DIAGNOSIS — D2361 Other benign neoplasm of skin of right upper limb, including shoulder: Secondary | ICD-10-CM

## 2019-10-31 DIAGNOSIS — Z1283 Encounter for screening for malignant neoplasm of skin: Secondary | ICD-10-CM | POA: Diagnosis not present

## 2019-10-31 DIAGNOSIS — L814 Other melanin hyperpigmentation: Secondary | ICD-10-CM

## 2019-10-31 DIAGNOSIS — L820 Inflamed seborrheic keratosis: Secondary | ICD-10-CM

## 2019-10-31 DIAGNOSIS — D1800 Hemangioma unspecified site: Secondary | ICD-10-CM

## 2019-10-31 DIAGNOSIS — L821 Other seborrheic keratosis: Secondary | ICD-10-CM

## 2019-10-31 DIAGNOSIS — D239 Other benign neoplasm of skin, unspecified: Secondary | ICD-10-CM

## 2019-10-31 DIAGNOSIS — L578 Other skin changes due to chronic exposure to nonionizing radiation: Secondary | ICD-10-CM

## 2019-10-31 NOTE — Progress Notes (Signed)
   Follow-Up Visit   Subjective  Melinda Sullivan is a 69 y.o. female who presents for the following: Annual Exam. The patient presents for Total-Body Skin Exam (TBSE) for skin cancer screening and mole check.  Patient here today for TBSE. No history of skin cancer and patient not aware of anything new or changing.  She complains of spots that are irritating and itching on the trunk and neck.  The following portions of the chart were reviewed this encounter and updated as appropriate:  Allergies  Meds  Problems  Med Hx  Surg Hx  Fam Hx     Review of Systems:  No other skin or systemic complaints except as noted in HPI or Assessment and Plan.  Objective  Well appearing patient in no apparent distress; mood and affect are within normal limits.  A full examination was performed including scalp, head, eyes, ears, nose, lips, neck, chest, axillae, abdomen, back, buttocks, bilateral upper extremities, bilateral lower extremities, hands, feet, fingers, toes, fingernails, and toenails. All findings within normal limits unless otherwise noted below.  Objective  Face: Mild pinkness at cheeks  Objective  Right Upper Arm: Firm pink/brown papulenodule with dimple sign.   Objective  Neck x 15, R side x 6, L side x 5 (26): Erythematous keratotic or waxy stuck-on papule or plaque.    Assessment & Plan  Rosacea Face  Benign, observe.    Dermatofibroma Right Upper Arm Mild, discussed laser treatment with BBL which we do here at [facility]. Benign, observe.   Inflamed seborrheic keratosis (26) Neck x 15, R side x 6, L side x 5  Destruction of lesion - Neck x 15, R side x 6, L side x 5 Complexity: simple   Destruction method: cryotherapy   Informed consent: discussed and consent obtained   Timeout:  patient name, date of birth, surgical site, and procedure verified Lesion destroyed using liquid nitrogen: Yes   Region frozen until ice ball extended beyond  lesion: Yes   Outcome: patient tolerated procedure well with no complications   Post-procedure details: wound care instructions given    Skin cancer screening   Lentigines - Scattered tan macules - Discussed due to sun exposure - Benign, observe - Call for any changes  Seborrheic Keratoses - Stuck-on, waxy, tan-brown papules and plaques  - Discussed benign etiology and prognosis. - Observe - Call for any changes  Melanocytic Nevi - Tan-brown and/or pink-flesh-colored symmetric macules and papules - Benign appearing on exam today - Observation - Call clinic for new or changing moles - Recommend daily use of broad spectrum spf 30+ sunscreen to sun-exposed areas.   Hemangiomas - Red papules - Discussed benign nature - Observe - Call for any changes  Actinic Damage - diffuse scaly erythematous macules with underlying dyspigmentation - Recommend daily broad spectrum sunscreen SPF 30+ to sun-exposed areas, reapply every 2 hours as needed.  - Call for new or changing lesions.  Skin cancer screening performed today.  Return in about 1 year (around 10/30/2020) for TBSE.  Graciella Belton, RMA, am acting as scribe for Sarina Ser, MD . Documentation: I have reviewed the above documentation for accuracy and completeness, and I agree with the above.  Sarina Ser, MD

## 2019-10-31 NOTE — Patient Instructions (Addendum)

## 2019-11-04 ENCOUNTER — Encounter: Payer: Self-pay | Admitting: Dermatology

## 2020-01-09 ENCOUNTER — Other Ambulatory Visit: Payer: Self-pay | Admitting: Family Medicine

## 2020-01-09 DIAGNOSIS — Z1231 Encounter for screening mammogram for malignant neoplasm of breast: Secondary | ICD-10-CM

## 2020-03-04 ENCOUNTER — Ambulatory Visit
Admission: RE | Admit: 2020-03-04 | Discharge: 2020-03-04 | Disposition: A | Discharge status: Home / Self Care | Payer: Medicare Other | Source: Ambulatory Visit | Attending: Family Medicine | Admitting: Family Medicine

## 2020-03-04 ENCOUNTER — Other Ambulatory Visit: Payer: Self-pay

## 2020-03-04 DIAGNOSIS — Z1231 Encounter for screening mammogram for malignant neoplasm of breast: Secondary | ICD-10-CM

## 2020-03-05 ENCOUNTER — Other Ambulatory Visit: Payer: Self-pay | Admitting: Family Medicine

## 2020-03-05 DIAGNOSIS — R928 Other abnormal and inconclusive findings on diagnostic imaging of breast: Secondary | ICD-10-CM

## 2020-03-21 ENCOUNTER — Ambulatory Visit
Admission: RE | Admit: 2020-03-21 | Discharge: 2020-03-21 | Disposition: A | Discharge status: Home / Self Care | Payer: Medicare Other | Source: Ambulatory Visit | Attending: Family Medicine | Admitting: Family Medicine

## 2020-03-21 ENCOUNTER — Other Ambulatory Visit: Payer: Self-pay

## 2020-03-21 ENCOUNTER — Other Ambulatory Visit: Payer: Self-pay | Admitting: Family Medicine

## 2020-03-21 DIAGNOSIS — R928 Other abnormal and inconclusive findings on diagnostic imaging of breast: Secondary | ICD-10-CM

## 2020-08-19 ENCOUNTER — Other Ambulatory Visit: Payer: Self-pay

## 2020-08-19 ENCOUNTER — Encounter (INDEPENDENT_AMBULATORY_CARE_PROVIDER_SITE_OTHER): Payer: Medicare Other | Admitting: Ophthalmology

## 2020-08-19 DIAGNOSIS — H43813 Vitreous degeneration, bilateral: Secondary | ICD-10-CM

## 2020-08-19 DIAGNOSIS — I10 Essential (primary) hypertension: Secondary | ICD-10-CM | POA: Diagnosis not present

## 2020-08-19 DIAGNOSIS — H35371 Puckering of macula, right eye: Secondary | ICD-10-CM | POA: Diagnosis not present

## 2020-08-19 DIAGNOSIS — H35341 Macular cyst, hole, or pseudohole, right eye: Secondary | ICD-10-CM | POA: Diagnosis not present

## 2020-08-19 DIAGNOSIS — H26493 Other secondary cataract, bilateral: Secondary | ICD-10-CM

## 2020-08-19 DIAGNOSIS — H35033 Hypertensive retinopathy, bilateral: Secondary | ICD-10-CM

## 2020-09-03 ENCOUNTER — Other Ambulatory Visit: Payer: Self-pay

## 2020-09-03 ENCOUNTER — Encounter (INDEPENDENT_AMBULATORY_CARE_PROVIDER_SITE_OTHER): Payer: Medicare Other | Admitting: Ophthalmology

## 2020-09-03 DIAGNOSIS — H26492 Other secondary cataract, left eye: Secondary | ICD-10-CM

## 2020-09-24 ENCOUNTER — Other Ambulatory Visit: Payer: Self-pay | Admitting: Family Medicine

## 2020-09-24 ENCOUNTER — Ambulatory Visit
Admission: RE | Admit: 2020-09-24 | Discharge: 2020-09-24 | Disposition: A | Discharge status: Home / Self Care | Payer: Medicare Other | Source: Ambulatory Visit | Attending: Family Medicine | Admitting: Family Medicine

## 2020-09-24 ENCOUNTER — Other Ambulatory Visit: Payer: Self-pay

## 2020-09-24 DIAGNOSIS — N6310 Unspecified lump in the right breast, unspecified quadrant: Secondary | ICD-10-CM

## 2020-09-24 DIAGNOSIS — R928 Other abnormal and inconclusive findings on diagnostic imaging of breast: Secondary | ICD-10-CM

## 2020-10-01 ENCOUNTER — Encounter (INDEPENDENT_AMBULATORY_CARE_PROVIDER_SITE_OTHER): Payer: Medicare Other | Admitting: Ophthalmology

## 2020-10-01 ENCOUNTER — Other Ambulatory Visit: Payer: Self-pay

## 2020-10-01 DIAGNOSIS — H2702 Aphakia, left eye: Secondary | ICD-10-CM

## 2020-10-13 ENCOUNTER — Other Ambulatory Visit (HOSPITAL_COMMUNITY): Payer: Self-pay

## 2020-11-05 ENCOUNTER — Ambulatory Visit: Payer: Medicare Other | Admitting: Dermatology

## 2020-11-05 ENCOUNTER — Encounter: Payer: Self-pay | Admitting: Dermatology

## 2020-11-05 ENCOUNTER — Other Ambulatory Visit: Payer: Self-pay

## 2020-11-05 DIAGNOSIS — L820 Inflamed seborrheic keratosis: Secondary | ICD-10-CM

## 2020-11-05 DIAGNOSIS — L814 Other melanin hyperpigmentation: Secondary | ICD-10-CM

## 2020-11-05 DIAGNOSIS — D1800 Hemangioma unspecified site: Secondary | ICD-10-CM

## 2020-11-05 DIAGNOSIS — D229 Melanocytic nevi, unspecified: Secondary | ICD-10-CM

## 2020-11-05 DIAGNOSIS — L84 Corns and callosities: Secondary | ICD-10-CM | POA: Diagnosis not present

## 2020-11-05 DIAGNOSIS — Z1283 Encounter for screening for malignant neoplasm of skin: Secondary | ICD-10-CM

## 2020-11-05 DIAGNOSIS — L578 Other skin changes due to chronic exposure to nonionizing radiation: Secondary | ICD-10-CM | POA: Diagnosis not present

## 2020-11-05 DIAGNOSIS — L821 Other seborrheic keratosis: Secondary | ICD-10-CM

## 2020-11-05 NOTE — Progress Notes (Signed)
   Follow-Up Visit   Subjective  Melinda Sullivan is a 70 y.o. female who presents for the following: Total body skin exam (No hx, check dry spots legs). The patient presents for Total-Body Skin Exam (TBSE) for skin cancer screening and mole check.  The following portions of the chart were reviewed this encounter and updated as appropriate:   Allergies  Meds  Problems  Med Hx  Surg Hx  Fam Hx     Review of Systems:  No other skin or systemic complaints except as noted in HPI or Assessment and Plan.  Objective  Well appearing patient in no apparent distress; mood and affect are within normal limits.  A full examination was performed including scalp, head, eyes, ears, nose, lips, neck, chest, axillae, abdomen, back, buttocks, bilateral upper extremities, bilateral lower extremities, hands, feet, fingers, toes, fingernails, and toenails. All findings within normal limits unless otherwise noted below.  R palm Hyperkeratotic pap  L ant thigh x 2, R knee x 2, L inframammary x 2, Total = 6 (6) Erythematous keratotic or waxy stuck-on papule or plaque.    Assessment & Plan   Lentigines - Scattered tan macules - Due to sun exposure - Benign-appering, observe - Recommend daily broad spectrum sunscreen SPF 30+ to sun-exposed areas, reapply every 2 hours as needed. - Call for any changes  Seborrheic Keratoses - Stuck-on, waxy, tan-brown papules and/or plaques  - Benign-appearing - Discussed benign etiology and prognosis. - Observe - Call for any changes  Melanocytic Nevi - Tan-brown and/or pink-flesh-colored symmetric macules and papules - Benign appearing on exam today - Observation - Call clinic for new or changing moles - Recommend daily use of broad spectrum spf 30+ sunscreen to sun-exposed areas.   Hemangiomas - Red papules - Discussed benign nature - Observe - Call for any changes  Actinic Damage - Chronic condition, secondary to cumulative UV/sun  exposure - diffuse scaly erythematous macules with underlying dyspigmentation - Recommend daily broad spectrum sunscreen SPF 30+ to sun-exposed areas, reapply every 2 hours as needed.  - Staying in the shade or wearing long sleeves, sun glasses (UVA+UVB protection) and wide brim hats (4-inch brim around the entire circumference of the hat) are also recommended for sun protection.  - Call for new or changing lesions.  Skin cancer screening performed today.  Callus R palm Callus Vs Wart Benign appearing Discussed LN2, pt declines  Inflamed seborrheic keratosis L ant thigh x 2, R knee x 2, L inframammary x 2, Total = 6  Destruction of lesion - L ant thigh x 2, R knee x 2, L inframammary x 2, Total = 6 Complexity: simple   Destruction method: cryotherapy   Informed consent: discussed and consent obtained   Timeout:  patient name, date of birth, surgical site, and procedure verified Lesion destroyed using liquid nitrogen: Yes   Region frozen until ice ball extended beyond lesion: Yes   Outcome: patient tolerated procedure well with no complications   Post-procedure details: wound care instructions given    Skin cancer screening  Return in about 1 year (around 11/05/2021) for TBSE.  I, Othelia Pulling, RMA, am acting as scribe for Sarina Ser, MD . Documentation: I have reviewed the above documentation for accuracy and completeness, and I agree with the above.  Sarina Ser, MD

## 2020-11-05 NOTE — Patient Instructions (Signed)

## 2021-03-31 ENCOUNTER — Other Ambulatory Visit: Payer: Medicare Other

## 2021-04-14 ENCOUNTER — Ambulatory Visit: Payer: Medicare Other

## 2021-04-14 ENCOUNTER — Ambulatory Visit
Admission: RE | Admit: 2021-04-14 | Discharge: 2021-04-14 | Disposition: A | Discharge status: Home / Self Care | Payer: Medicare Other | Source: Ambulatory Visit | Attending: Family Medicine | Admitting: Family Medicine

## 2021-04-14 DIAGNOSIS — N6310 Unspecified lump in the right breast, unspecified quadrant: Secondary | ICD-10-CM

## 2021-10-01 ENCOUNTER — Encounter (INDEPENDENT_AMBULATORY_CARE_PROVIDER_SITE_OTHER): Payer: Medicare Other | Admitting: Ophthalmology

## 2021-10-01 DIAGNOSIS — I10 Essential (primary) hypertension: Secondary | ICD-10-CM | POA: Diagnosis not present

## 2021-10-01 DIAGNOSIS — H35033 Hypertensive retinopathy, bilateral: Secondary | ICD-10-CM | POA: Diagnosis not present

## 2021-10-01 DIAGNOSIS — H35341 Macular cyst, hole, or pseudohole, right eye: Secondary | ICD-10-CM

## 2021-10-01 DIAGNOSIS — H43813 Vitreous degeneration, bilateral: Secondary | ICD-10-CM | POA: Diagnosis not present

## 2021-10-01 DIAGNOSIS — H35371 Puckering of macula, right eye: Secondary | ICD-10-CM

## 2021-11-11 ENCOUNTER — Ambulatory Visit: Payer: Medicare Other | Admitting: Dermatology

## 2021-11-11 ENCOUNTER — Encounter: Payer: Self-pay | Admitting: Dermatology

## 2021-11-11 DIAGNOSIS — D229 Melanocytic nevi, unspecified: Secondary | ICD-10-CM

## 2021-11-11 DIAGNOSIS — L821 Other seborrheic keratosis: Secondary | ICD-10-CM

## 2021-11-11 DIAGNOSIS — L578 Other skin changes due to chronic exposure to nonionizing radiation: Secondary | ICD-10-CM

## 2021-11-11 DIAGNOSIS — Z808 Family history of malignant neoplasm of other organs or systems: Secondary | ICD-10-CM

## 2021-11-11 DIAGNOSIS — Z1283 Encounter for screening for malignant neoplasm of skin: Secondary | ICD-10-CM

## 2021-11-11 DIAGNOSIS — L814 Other melanin hyperpigmentation: Secondary | ICD-10-CM

## 2021-11-11 DIAGNOSIS — L720 Epidermal cyst: Secondary | ICD-10-CM

## 2021-11-11 DIAGNOSIS — D1800 Hemangioma unspecified site: Secondary | ICD-10-CM

## 2021-11-11 NOTE — Patient Instructions (Signed)
Due to recent changes in healthcare laws, you may see results of your pathology and/or laboratory studies on MyChart before the doctors have had a chance to review them. We understand that in some cases there may be results that are confusing or concerning to you. Please understand that not all results are received at the same time and often the doctors may need to interpret multiple results in order to provide you with the best plan of care or course of treatment. Therefore, we ask that you please give us 2 business days to thoroughly review all your results before contacting the office for clarification. Should we see a critical lab result, you will be contacted sooner.   If You Need Anything After Your Visit  If you have any questions or concerns for your doctor, please call our main line at [phone] and press option 4 to reach your doctor's medical assistant. If no one answers, please leave a voicemail as directed and we will return your call as soon as possible. Messages left after 4 pm will be answered the following business day.   You may also send us a message via MyChart. We typically respond to MyChart messages within 1-2 business days.  For prescription refills, please ask your pharmacy to contact our office. Our fax number is [phone].  If you have an urgent issue when the clinic is closed that cannot wait until the next business day, you can page your doctor at the number below.    Please note that while we do our best to be available for urgent issues outside of office hours, we are not available 24/7.   If you have an urgent issue and are unable to reach us, you may choose to seek medical care at your doctor's office, retail clinic, urgent care center, or emergency room.  If you have a medical emergency, please immediately call 911 or go to the emergency department.  Pager Numbers  - Dr. Kowalski: [phone]  - Dr. Moye: [phone]  - Dr. Stewart:  [phone]  In the event of inclement weather, please call our main line at [phone] for an update on the status of any delays or closures.  Dermatology Medication Tips: Please keep the boxes that topical medications come in in order to help keep track of the instructions about where and how to use these. Pharmacies typically print the medication instructions only on the boxes and not directly on the medication tubes.   If your medication is too expensive, please contact our office at [phone] option 4 or send us a message through MyChart.   We are unable to tell what your co-pay for medications will be in advance as this is different depending on your insurance coverage. However, we may be able to find a substitute medication at lower cost or fill out paperwork to get insurance to cover a needed medication.   If a prior authorization is required to get your medication covered by your insurance company, please allow us 1-2 business days to complete this process.  Drug prices often vary depending on where the prescription is filled and some pharmacies may offer cheaper prices.  The website www.goodrx.com contains coupons for medications through different pharmacies. The prices here do not account for what the cost may be with help from insurance (it may be cheaper with your insurance), but the website can give you the price if you did not use any insurance.  - You can print the associated coupon and take it with   your prescription to the pharmacy.  - You may also stop by our office during regular business hours and pick up a GoodRx coupon card.  - If you need your prescription sent electronically to a different pharmacy, notify our office through [facility] MyChart or by phone at [phone] option 4.     Si Usted Necesita Algo Despus de Su Visita  Tambin puede enviarnos un mensaje a travs de MyChart. Por lo general respondemos a los mensajes de MyChart en el transcurso de 1 a 2  das hbiles.  Para renovar recetas, por favor pida a su farmacia que se ponga en contacto con nuestra oficina. Nuestro nmero de fax es el [phone].  Si tiene un asunto urgente cuando la clnica est cerrada y que no puede esperar hasta el siguiente da hbil, puede llamar/localizar a su doctor(a) al nmero que aparece a continuacin.   Por favor, tenga en cuenta que aunque hacemos todo lo posible para estar disponibles para asuntos urgentes fuera del horario de oficina, no estamos disponibles las 24 horas del da, los 7 das de la semana.   Si tiene un problema urgente y no puede comunicarse con nosotros, puede optar por buscar atencin mdica  en el consultorio de su doctor(a), en una clnica privada, en un centro de atencin urgente o en una sala de emergencias.  Si tiene una emergencia mdica, por favor llame inmediatamente al 911 o vaya a la sala de emergencias.  Nmeros de bper  - Dr. Kowalski: [phone]  - Dra. Moye: [phone]  - Dra. Stewart: [phone]  En caso de inclemencias del tiempo, por favor llame a nuestra lnea principal al [phone] para una actualizacin sobre el estado de cualquier retraso o cierre.  Consejos para la medicacin en dermatologa: Por favor, guarde las cajas en las que vienen los medicamentos de uso tpico para ayudarle a seguir las instrucciones sobre dnde y cmo usarlos. Las farmacias generalmente imprimen las instrucciones del medicamento slo en las cajas y no directamente en los tubos del medicamento.   Si su medicamento es muy caro, por favor, pngase en contacto con nuestra oficina llamando al [phone] y presione la opcin 4 o envenos un mensaje a travs de MyChart.   No podemos decirle cul ser su copago por los medicamentos por adelantado ya que esto es diferente dependiendo de la cobertura de su seguro. Sin embargo, es posible que podamos encontrar un medicamento sustituto a menor costo o llenar un formulario para que el  seguro cubra el medicamento que se considera necesario.   Si se requiere una autorizacin previa para que su compaa de seguros cubra su medicamento, por favor permtanos de 1 a 2 das hbiles para completar este proceso.  Los precios de los medicamentos varan con frecuencia dependiendo del lugar de dnde se surte la receta y alguna farmacias pueden ofrecer precios ms baratos.  El sitio web www.goodrx.com tiene cupones para medicamentos de diferentes farmacias. Los precios aqu no tienen en cuenta lo que podra costar con la ayuda del seguro (puede ser ms barato con su seguro), pero el sitio web puede darle el precio si no utiliz ningn seguro.  - Puede imprimir el cupn correspondiente y llevarlo con su receta a la farmacia.  - Tambin puede pasar por nuestra oficina durante el horario de atencin regular y recoger una tarjeta de cupones de GoodRx.  - Si necesita que su receta se enve electrnicamente a una farmacia diferente, informe a nuestra oficina a travs de MyChart de [place]   o por telfono llamando al [phone] y presione la opcin 4.  

## 2021-11-11 NOTE — Progress Notes (Signed)
   Follow-Up Visit   Subjective  Melinda Sullivan is a 71 y.o. female who presents for the following: Annual Exam (Fhx MM in sister). The patient presents for Total-Body Skin Exam (TBSE) for skin cancer screening and mole check.  The patient has spots, moles and lesions to be evaluated, some may be new or changing and the patient has concerns that these could be cancer.  The following portions of the chart were reviewed this encounter and updated as appropriate:   Tobacco  Allergies  Meds  Problems  Med Hx  Surg Hx  Fam Hx     Review of Systems:  No other skin or systemic complaints except as noted in HPI or Assessment and Plan.  Objective  Well appearing patient in no apparent distress; mood and affect are within normal limits.  A full examination was performed including scalp, head, eyes, ears, nose, lips, neck, chest, axillae, abdomen, back, buttocks, bilateral upper extremities, bilateral lower extremities, hands, feet, fingers, toes, fingernails, and toenails. All findings within normal limits unless otherwise noted below.   Assessment & Plan   Lentigines - Scattered tan macules - Due to sun exposure - Benign-appearing, observe - Recommend daily broad spectrum sunscreen SPF 30+ to sun-exposed areas, reapply every 2 hours as needed. - Call for any changes  Seborrheic Keratoses - Stuck-on, waxy, tan-brown papules and/or plaques  - Benign-appearing - Discussed benign etiology and prognosis. - Observe - Call for any changes  Melanocytic Nevi - Tan-brown and/or pink-flesh-colored symmetric macules and papules - Benign appearing on exam today - Observation - Call clinic for new or changing moles - Recommend daily use of broad spectrum spf 30+ sunscreen to sun-exposed areas.   Hemangiomas - Red papules - Discussed benign nature - Observe - Call for any changes  Actinic Damage - Chronic condition, secondary to cumulative UV/sun exposure - diffuse scaly  erythematous macules with underlying dyspigmentation - Recommend daily broad spectrum sunscreen SPF 30+ to sun-exposed areas, reapply every 2 hours as needed.  - Staying in the shade or wearing long sleeves, sun glasses (UVA+UVB protection) and wide brim hats (4-inch brim around the entire circumference of the hat) are also recommended for sun protection.  - Call for new or changing lesions.  Family history of skin cancer   Milia - tiny firm white papules - type of cyst - benign - may be extracted if symptomatic - observe  Skin cancer screening performed today.  Return in about 1 year (around 11/12/2022) for TBSE.  Luther Redo, CMA, am acting as scribe for Sarina Ser, MD .  Documentation: I have reviewed the above documentation for accuracy and completeness, and I agree with the above.  Sarina Ser, MD

## 2022-03-05 ENCOUNTER — Other Ambulatory Visit: Payer: Self-pay | Admitting: Family Medicine

## 2022-03-05 DIAGNOSIS — Z1231 Encounter for screening mammogram for malignant neoplasm of breast: Secondary | ICD-10-CM

## 2022-04-29 ENCOUNTER — Ambulatory Visit
Admission: RE | Admit: 2022-04-29 | Discharge: 2022-04-29 | Disposition: A | Discharge status: Home / Self Care | Payer: Medicare Other | Source: Ambulatory Visit | Attending: Family Medicine | Admitting: Family Medicine

## 2022-04-29 DIAGNOSIS — Z1231 Encounter for screening mammogram for malignant neoplasm of breast: Secondary | ICD-10-CM

## 2022-06-09 ENCOUNTER — Ambulatory Visit: Payer: Medicare Other

## 2022-06-09 DIAGNOSIS — Z86010 Personal history of colonic polyps: Secondary | ICD-10-CM | POA: Diagnosis not present

## 2022-06-09 DIAGNOSIS — K640 First degree hemorrhoids: Secondary | ICD-10-CM | POA: Diagnosis not present

## 2022-06-09 DIAGNOSIS — K56699 Other intestinal obstruction unspecified as to partial versus complete obstruction: Secondary | ICD-10-CM | POA: Diagnosis not present

## 2022-06-09 DIAGNOSIS — K621 Rectal polyp: Secondary | ICD-10-CM | POA: Diagnosis not present

## 2022-06-09 DIAGNOSIS — K5730 Diverticulosis of large intestine without perforation or abscess without bleeding: Secondary | ICD-10-CM | POA: Diagnosis not present

## 2022-06-09 DIAGNOSIS — D123 Benign neoplasm of transverse colon: Secondary | ICD-10-CM | POA: Diagnosis not present

## 2022-06-09 DIAGNOSIS — Z1211 Encounter for screening for malignant neoplasm of colon: Secondary | ICD-10-CM | POA: Diagnosis present

## 2022-06-09 DIAGNOSIS — D120 Benign neoplasm of cecum: Secondary | ICD-10-CM | POA: Diagnosis not present

## 2022-09-29 ENCOUNTER — Encounter (INDEPENDENT_AMBULATORY_CARE_PROVIDER_SITE_OTHER): Payer: Medicare Other | Admitting: Ophthalmology

## 2022-09-29 DIAGNOSIS — H35341 Macular cyst, hole, or pseudohole, right eye: Secondary | ICD-10-CM | POA: Diagnosis not present

## 2022-09-29 DIAGNOSIS — H35033 Hypertensive retinopathy, bilateral: Secondary | ICD-10-CM | POA: Diagnosis not present

## 2022-09-29 DIAGNOSIS — I10 Essential (primary) hypertension: Secondary | ICD-10-CM | POA: Diagnosis not present

## 2022-09-29 DIAGNOSIS — H43813 Vitreous degeneration, bilateral: Secondary | ICD-10-CM | POA: Diagnosis not present

## 2022-09-29 DIAGNOSIS — H35373 Puckering of macula, bilateral: Secondary | ICD-10-CM

## 2022-12-16 ENCOUNTER — Ambulatory Visit: Payer: Medicare Other | Admitting: Dermatology

## 2023-04-07 ENCOUNTER — Other Ambulatory Visit: Payer: Self-pay | Admitting: Family Medicine

## 2023-04-07 DIAGNOSIS — Z0000 Encounter for general adult medical examination without abnormal findings: Secondary | ICD-10-CM

## 2023-04-28 ENCOUNTER — Ambulatory Visit: Payer: Medicare Other | Admitting: Dermatology

## 2023-05-02 ENCOUNTER — Ambulatory Visit
Admission: RE | Admit: 2023-05-02 | Discharge: 2023-05-02 | Disposition: A | Discharge status: Home / Self Care | Payer: Medicare Other | Source: Ambulatory Visit | Attending: Family Medicine | Admitting: Family Medicine

## 2023-05-02 DIAGNOSIS — Z0000 Encounter for general adult medical examination without abnormal findings: Secondary | ICD-10-CM

## 2023-05-19 ENCOUNTER — Ambulatory Visit: Payer: Medicare Other | Admitting: Dermatology

## 2023-05-19 DIAGNOSIS — Z1283 Encounter for screening for malignant neoplasm of skin: Secondary | ICD-10-CM

## 2023-05-19 DIAGNOSIS — L578 Other skin changes due to chronic exposure to nonionizing radiation: Secondary | ICD-10-CM

## 2023-05-19 DIAGNOSIS — D2239 Melanocytic nevi of other parts of face: Secondary | ICD-10-CM

## 2023-05-19 DIAGNOSIS — L814 Other melanin hyperpigmentation: Secondary | ICD-10-CM

## 2023-05-19 DIAGNOSIS — L820 Inflamed seborrheic keratosis: Secondary | ICD-10-CM | POA: Diagnosis not present

## 2023-05-19 DIAGNOSIS — D239 Other benign neoplasm of skin, unspecified: Secondary | ICD-10-CM

## 2023-05-19 DIAGNOSIS — D2361 Other benign neoplasm of skin of right upper limb, including shoulder: Secondary | ICD-10-CM

## 2023-05-19 DIAGNOSIS — L853 Xerosis cutis: Secondary | ICD-10-CM

## 2023-05-19 DIAGNOSIS — W908XXA Exposure to other nonionizing radiation, initial encounter: Secondary | ICD-10-CM

## 2023-05-19 DIAGNOSIS — D229 Melanocytic nevi, unspecified: Secondary | ICD-10-CM

## 2023-05-19 DIAGNOSIS — D1801 Hemangioma of skin and subcutaneous tissue: Secondary | ICD-10-CM

## 2023-05-19 DIAGNOSIS — L821 Other seborrheic keratosis: Secondary | ICD-10-CM

## 2023-05-19 NOTE — Patient Instructions (Addendum)

## 2023-05-19 NOTE — Progress Notes (Signed)
Follow-Up Visit   Subjective  Melinda Sullivan is a 73 y.o. female who presents for the following: Skin Cancer Screening and Full Body Skin Exam, spot on L antecubital, L groin, back bra line she would like looked at that are irritated and itchy. Itchy spot at L flank.  The patient presents for Total-Body Skin Exam (TBSE) for skin cancer screening and mole check. The patient has spots, moles and lesions to be evaluated, some may be new or changing and the patient may have concern these could be cancer.    The following portions of the chart were reviewed this encounter and updated as appropriate: medications, allergies, medical history  Review of Systems:  No other skin or systemic complaints except as noted in HPI or Assessment and Plan.  Objective  Well appearing patient in no apparent distress; mood and affect are within normal limits.  A full examination was performed including scalp, head, eyes, ears, nose, lips, neck, chest, axillae, abdomen, back, buttocks, bilateral upper extremities, bilateral lower extremities, hands, feet, fingers, toes, fingernails, and toenails. All findings within normal limits unless otherwise noted below.   Relevant physical exam findings are noted in the Assessment and Plan.  L antecubetum x 1, L inguinal crease x 2, L flank bra line x1 (4) Stuck on waxy paps with erythema  Assessment & Plan   SKIN CANCER SCREENING PERFORMED TODAY.  ACTINIC DAMAGE - Chronic condition, secondary to cumulative UV/sun exposure - diffuse scaly erythematous macules with underlying dyspigmentation - Recommend daily broad spectrum sunscreen SPF 30+ to sun-exposed areas, reapply every 2 hours as needed.  - Staying in the shade or wearing long sleeves, sun glasses (UVA+UVB protection) and wide brim hats (4-inch brim around the entire circumference of the hat) are also recommended for sun protection.  - Call for new or changing lesions.  LENTIGINES, SEBORRHEIC  KERATOSES, HEMANGIOMAS - Benign normal skin lesions - Benign-appearing - Call for any changes  MELANOCYTIC NEVI - Tan-brown and/or pink-flesh-colored symmetric macules and papules, including flesh papule at L cheek  - Benign appearing on exam today - Observation - Call clinic for new or changing moles - Recommend daily use of broad spectrum spf 30+ sunscreen to sun-exposed areas.     DERMATOFIBROMA Exam: Firm pink/brown papulenodule with dimple sign at R upper arm   Treatment Plan: A dermatofibroma is a benign growth possibly related to trauma, such as an insect bite, cut from shaving, or inflamed acne-type bump.  Treatment options to remove include shave or excision with resulting scar and risk of recurrence.  Since benign-appearing and not bothersome, will observe for now.     Xerosis - diffuse xerotic patches - recommend gentle, hydrating skin care - gentle skin care handout given    INFLAMED SEBORRHEIC KERATOSIS (4) L antecubetum x 1, L inguinal crease x 2, L flank bra line x1 (4) Symptomatic, irritating, patient would like treated. Destruction of lesion - L antecubetum x 1, L inguinal crease x 2, L flank bra line x1 (4)  Destruction method: cryotherapy   Informed consent: discussed and consent obtained   Lesion destroyed using liquid nitrogen: Yes   Region frozen until ice ball extended beyond lesion: Yes   Outcome: patient tolerated procedure well with no complications   Post-procedure details: wound care instructions given   Additional details:  Prior to procedure, discussed risks of blister formation, small wound, skin dyspigmentation, or rare scar following cryotherapy. Recommend Vaseline ointment to treated areas while healing.  Return in about  1 year (around 05/18/2024) for TBSE, w/ Dr. Gwen Pounds.  I, Soundra Pilon, CMA, am acting as scribe for Willeen Niece, MD .   Documentation: I have reviewed the above documentation for accuracy and completeness, and I agree  with the above.  Willeen Niece, MD
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
## 2023-09-28 ENCOUNTER — Encounter (INDEPENDENT_AMBULATORY_CARE_PROVIDER_SITE_OTHER): Payer: Medicare Other | Admitting: Ophthalmology

## 2023-09-28 DIAGNOSIS — I10 Essential (primary) hypertension: Secondary | ICD-10-CM | POA: Diagnosis not present

## 2023-09-28 DIAGNOSIS — H35341 Macular cyst, hole, or pseudohole, right eye: Secondary | ICD-10-CM

## 2023-09-28 DIAGNOSIS — H43813 Vitreous degeneration, bilateral: Secondary | ICD-10-CM | POA: Diagnosis not present

## 2023-09-28 DIAGNOSIS — H35373 Puckering of macula, bilateral: Secondary | ICD-10-CM | POA: Diagnosis not present

## 2023-09-28 DIAGNOSIS — H35033 Hypertensive retinopathy, bilateral: Secondary | ICD-10-CM

## 2024-01-02 IMAGING — MG DIGITAL DIAGNOSTIC BILAT W/ TOMO W/ CAD
6 of 12 series · 6 of 36 positions shown · non-contrast
Comparison: Previous exam(s).

CLINICAL DATA: Patient for follow-up of probably benign right
breast mass favored to represent fat necrosis.

EXAM:
DIGITAL DIAGNOSTIC BILATERAL MAMMOGRAM WITH TOMOSYNTHESIS AND CAD
TECHNIQUE: Bilateral digital diagnostic mammography and breast tomosynthesis
was performed. The images were evaluated with computer-aided
detection.

[L CC synth-2D (1 of 3)]
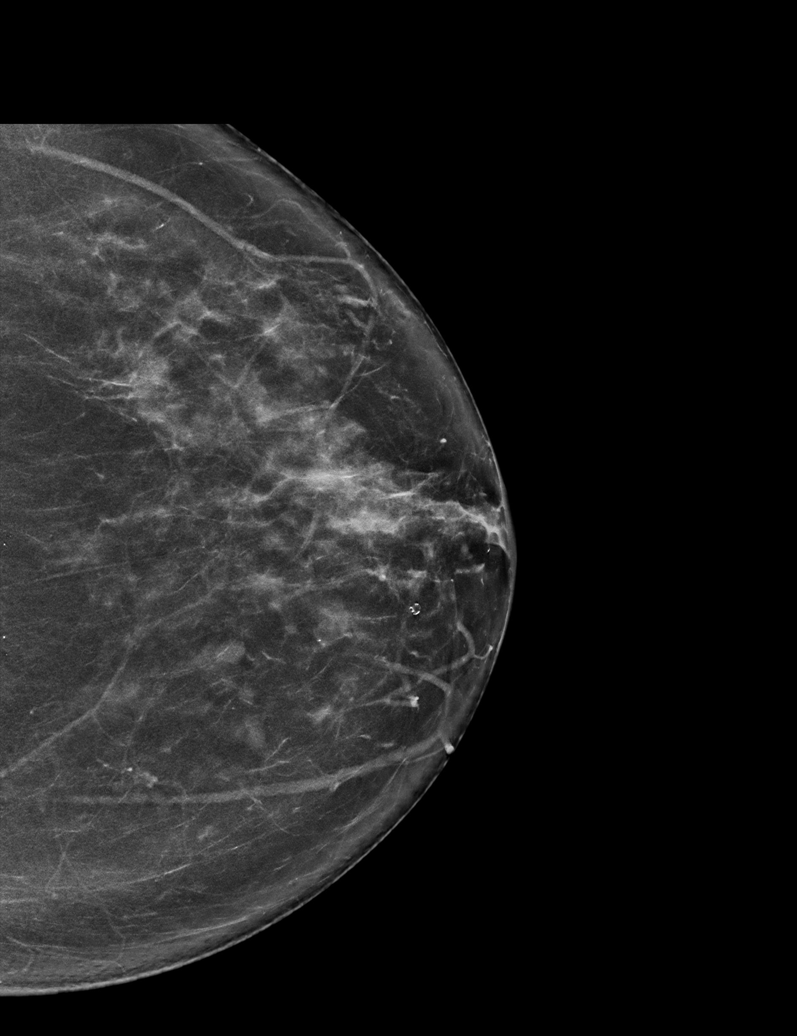

[L MLO synth-2D]
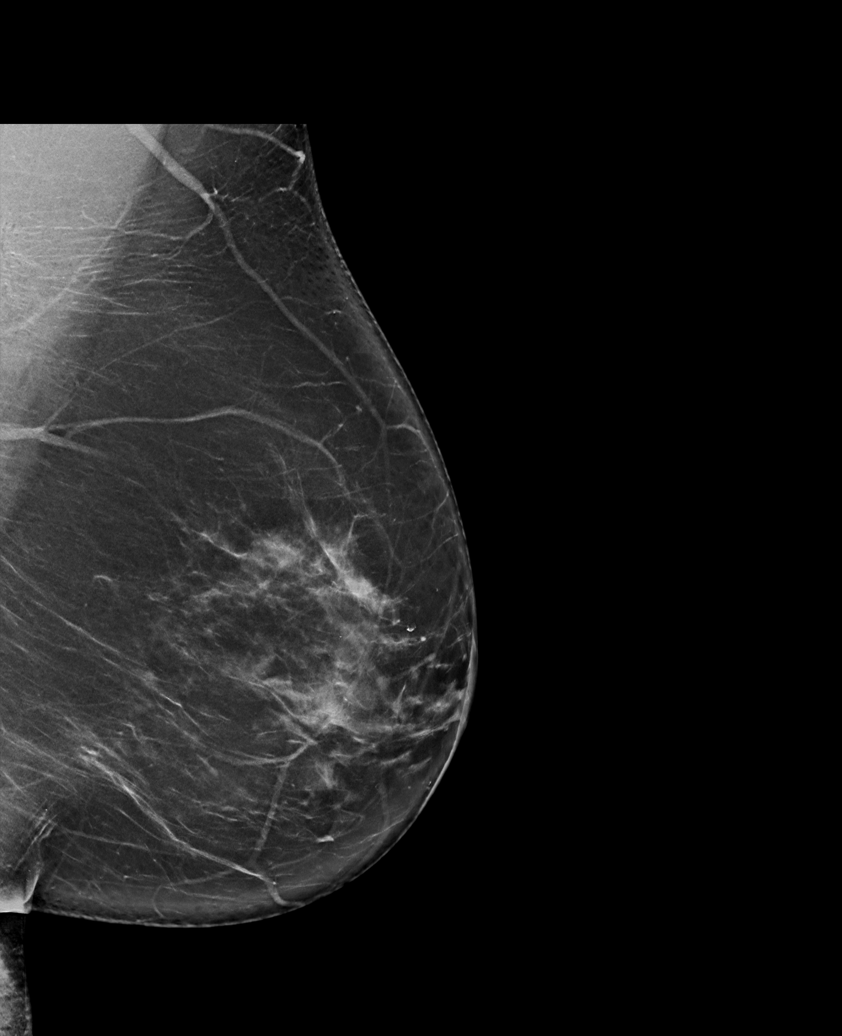

[R MLO synth-2D]
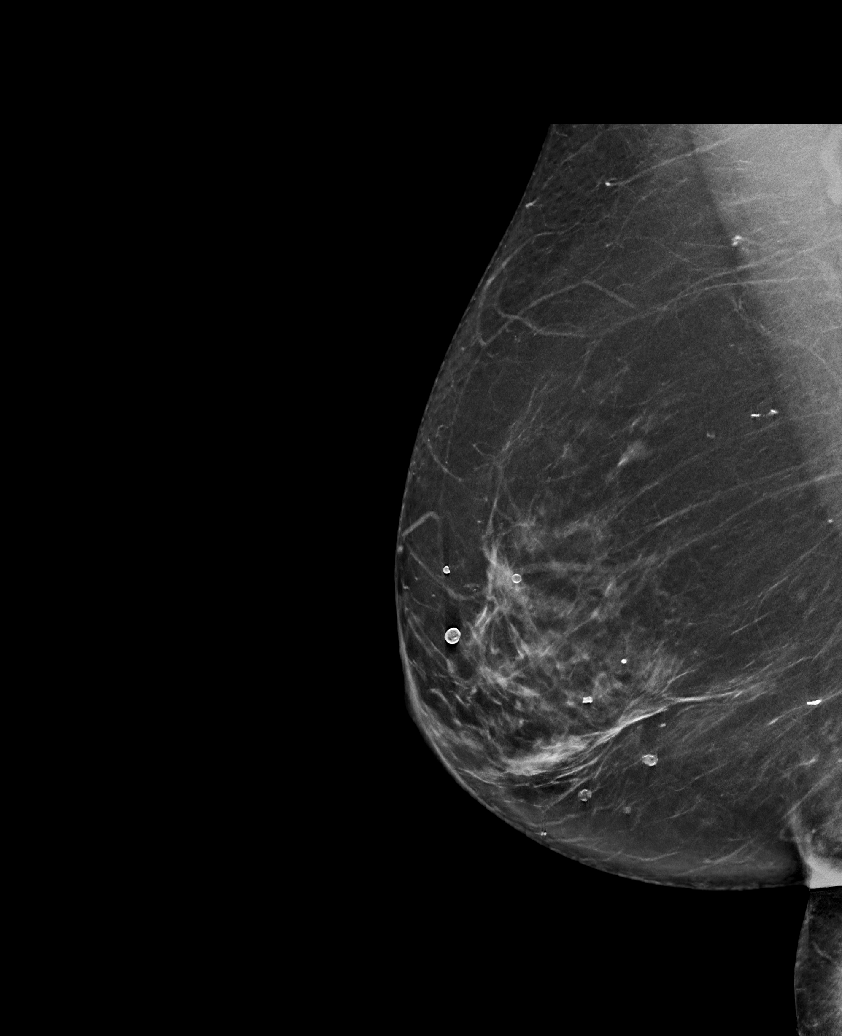

[L CC synth-2D (2 of 3)]
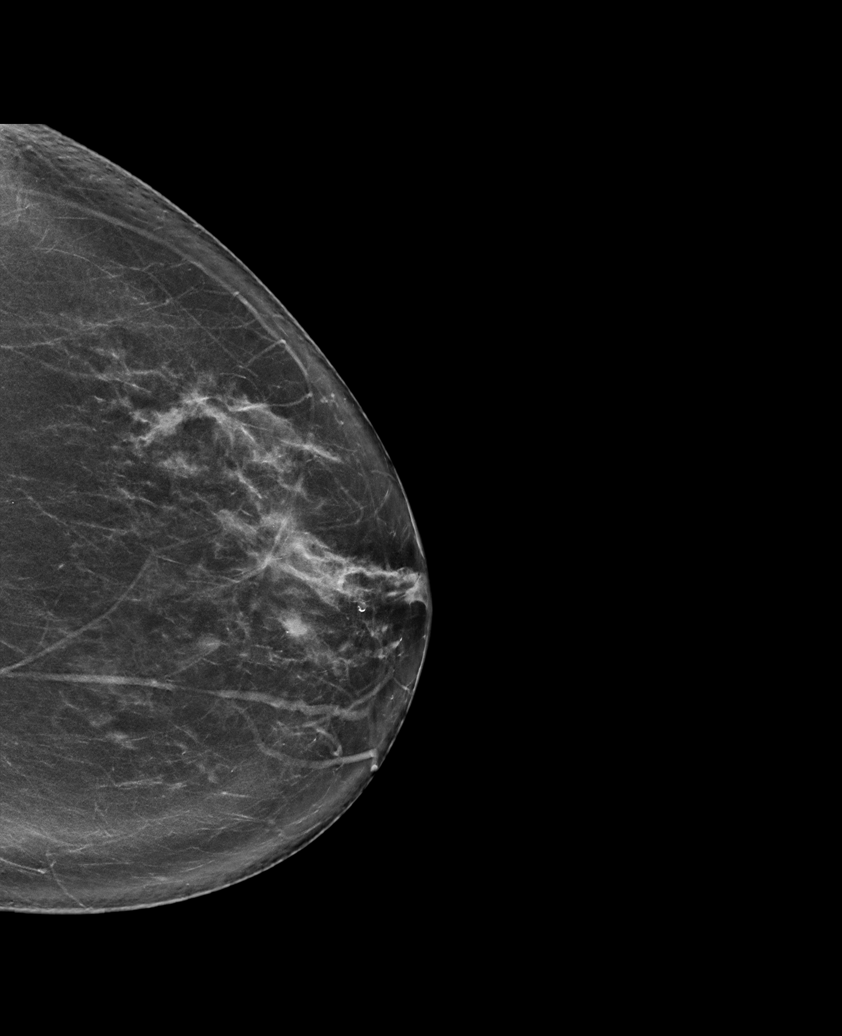

[L CC synth-2D (3 of 3)]
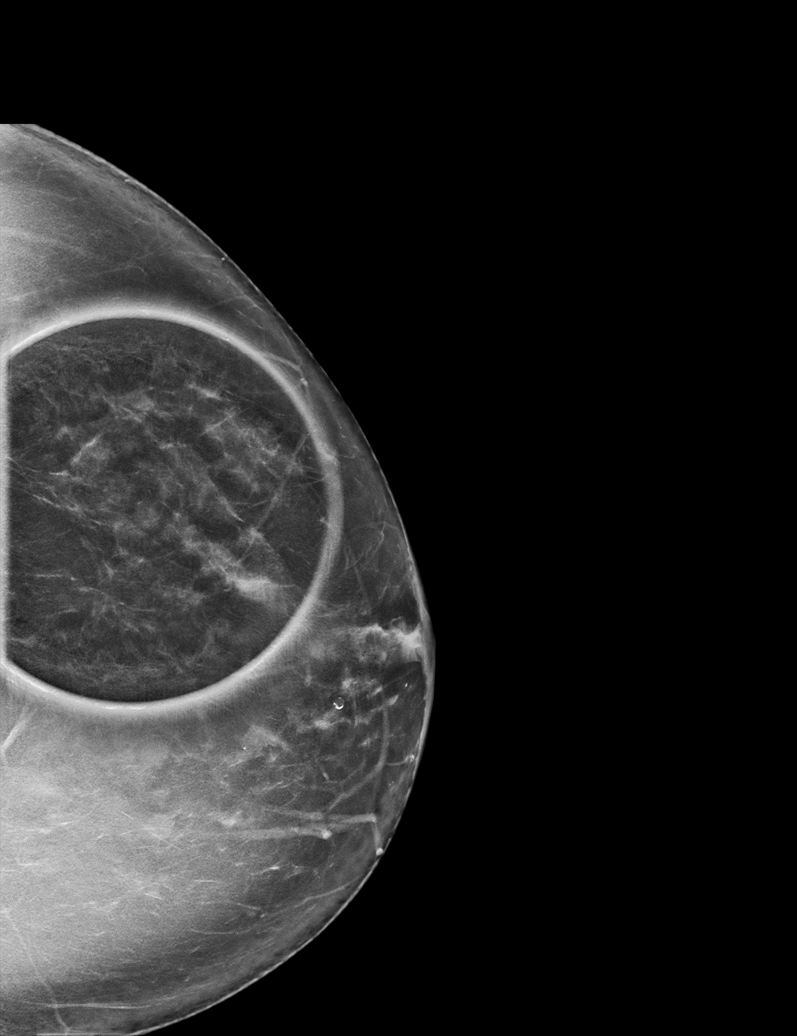

[R CC synth-2D]
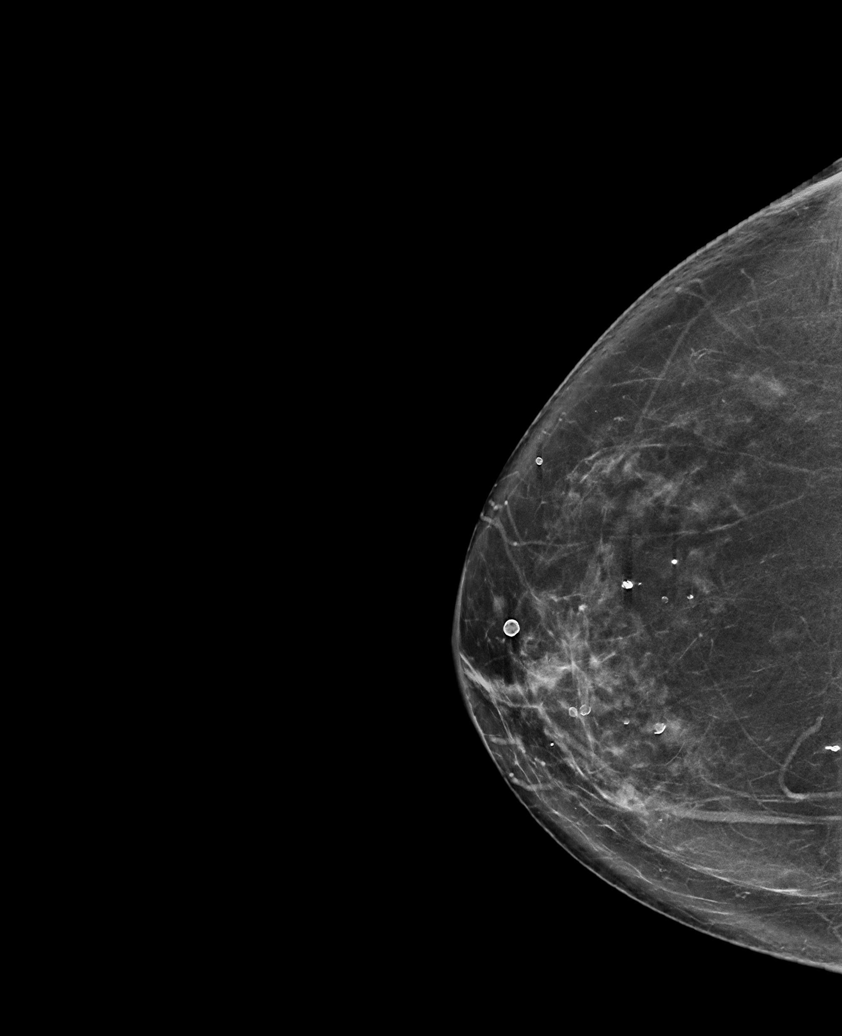

[6 of 36 positions shown; findings below may reference images not displayed]

ACR Breast Density Category c: The breast tissue is heterogeneously
dense, which may obscure small masses.
FINDINGS: Interval calcification of the previously visualized mass within the
posterior slightly medial right breast. No new masses,
calcifications or distortion identified within either breast.
IMPRESSION: Right breast mass most compatible with fat necrosis given interval
calcification.

No mammographic evidence for malignancy.

RECOMMENDATION:
Screening mammogram in one year.(Code:74-1-WJR)

I have discussed the findings and recommendations with the patient.
If applicable, a reminder letter will be sent to the patient
regarding the next appointment.

BI-RADS CATEGORY  2: Benign.

## 2024-04-16 ENCOUNTER — Other Ambulatory Visit: Payer: Self-pay | Admitting: Family Medicine

## 2024-04-16 DIAGNOSIS — Z1231 Encounter for screening mammogram for malignant neoplasm of breast: Secondary | ICD-10-CM

## 2024-05-02 ENCOUNTER — Inpatient Hospital Stay: Admission: RE | Admit: 2024-05-02 | Source: Ambulatory Visit

## 2024-05-16 ENCOUNTER — Ambulatory Visit

## 2024-05-23 ENCOUNTER — Ambulatory Visit: Payer: Medicare Other | Admitting: Dermatology

## 2024-09-26 ENCOUNTER — Encounter (INDEPENDENT_AMBULATORY_CARE_PROVIDER_SITE_OTHER): Admitting: Ophthalmology
